# Patient Record
Sex: Male | Born: 1940 | Race: White | Hispanic: No | Marital: Married | Smoking: Former smoker
Health system: Southern US, Community
[De-identification: ages and names within clinical notes are randomized; demographics above are authoritative.]

## PROBLEM LIST (undated history)

## (undated) DIAGNOSIS — C801 Malignant (primary) neoplasm, unspecified: Secondary | ICD-10-CM

## (undated) DIAGNOSIS — R51 Headache: Secondary | ICD-10-CM

## (undated) DIAGNOSIS — J189 Pneumonia, unspecified organism: Secondary | ICD-10-CM

## (undated) DIAGNOSIS — R7303 Prediabetes: Secondary | ICD-10-CM

## (undated) DIAGNOSIS — Z87442 Personal history of urinary calculi: Secondary | ICD-10-CM

## (undated) DIAGNOSIS — I219 Acute myocardial infarction, unspecified: Secondary | ICD-10-CM

## (undated) DIAGNOSIS — G4730 Sleep apnea, unspecified: Secondary | ICD-10-CM

## (undated) DIAGNOSIS — H9190 Unspecified hearing loss, unspecified ear: Secondary | ICD-10-CM

## (undated) DIAGNOSIS — I2510 Atherosclerotic heart disease of native coronary artery without angina pectoris: Secondary | ICD-10-CM

## (undated) DIAGNOSIS — I4891 Unspecified atrial fibrillation: Secondary | ICD-10-CM

## (undated) DIAGNOSIS — E039 Hypothyroidism, unspecified: Secondary | ICD-10-CM

## (undated) DIAGNOSIS — I10 Essential (primary) hypertension: Secondary | ICD-10-CM

## (undated) DIAGNOSIS — M1990 Unspecified osteoarthritis, unspecified site: Secondary | ICD-10-CM

## (undated) DIAGNOSIS — E7800 Pure hypercholesterolemia, unspecified: Secondary | ICD-10-CM

## (undated) DIAGNOSIS — Z973 Presence of spectacles and contact lenses: Secondary | ICD-10-CM

## (undated) DIAGNOSIS — R519 Headache, unspecified: Secondary | ICD-10-CM

## (undated) HISTORY — PX: OTHER SURGICAL HISTORY: SHX169

## (undated) HISTORY — PX: CARDIAC CATHETERIZATION: SHX172

## (undated) HISTORY — PX: SKIN CANCER EXCISION: SHX779

## (undated) HISTORY — PX: CORONARY ARTERY BYPASS GRAFT: SHX141

## (undated) HISTORY — PX: SHOULDER SURGERY: SHX246

## (undated) HISTORY — PX: BACK SURGERY: SHX140

## (undated) HISTORY — PX: COLONOSCOPY: SHX174

## (undated) HISTORY — PX: MULTIPLE TOOTH EXTRACTIONS: SHX2053

---

## 2017-08-16 ENCOUNTER — Other Ambulatory Visit: Payer: Self-pay | Admitting: Orthopedic Surgery

## 2017-09-08 ENCOUNTER — Encounter (HOSPITAL_COMMUNITY)
Admission: RE | Admit: 2017-09-08 | Discharge: 2017-09-08 | Disposition: A | Discharge status: Home / Self Care | Payer: Medicare Other | Source: Ambulatory Visit | Attending: Orthopedic Surgery | Admitting: Orthopedic Surgery

## 2017-09-08 ENCOUNTER — Other Ambulatory Visit: Payer: Self-pay

## 2017-09-08 ENCOUNTER — Encounter (HOSPITAL_COMMUNITY): Payer: Self-pay

## 2017-09-08 DIAGNOSIS — E039 Hypothyroidism, unspecified: Secondary | ICD-10-CM | POA: Insufficient documentation

## 2017-09-08 DIAGNOSIS — Z79899 Other long term (current) drug therapy: Secondary | ICD-10-CM | POA: Diagnosis not present

## 2017-09-08 DIAGNOSIS — G4733 Obstructive sleep apnea (adult) (pediatric): Secondary | ICD-10-CM | POA: Insufficient documentation

## 2017-09-08 DIAGNOSIS — I4891 Unspecified atrial fibrillation: Secondary | ICD-10-CM | POA: Insufficient documentation

## 2017-09-08 DIAGNOSIS — Z7901 Long term (current) use of anticoagulants: Secondary | ICD-10-CM | POA: Diagnosis not present

## 2017-09-08 DIAGNOSIS — Z951 Presence of aortocoronary bypass graft: Secondary | ICD-10-CM | POA: Diagnosis not present

## 2017-09-08 DIAGNOSIS — Z7982 Long term (current) use of aspirin: Secondary | ICD-10-CM | POA: Insufficient documentation

## 2017-09-08 DIAGNOSIS — I10 Essential (primary) hypertension: Secondary | ICD-10-CM | POA: Diagnosis not present

## 2017-09-08 DIAGNOSIS — M75122 Complete rotator cuff tear or rupture of left shoulder, not specified as traumatic: Secondary | ICD-10-CM | POA: Diagnosis not present

## 2017-09-08 DIAGNOSIS — I2510 Atherosclerotic heart disease of native coronary artery without angina pectoris: Secondary | ICD-10-CM | POA: Insufficient documentation

## 2017-09-08 DIAGNOSIS — E119 Type 2 diabetes mellitus without complications: Secondary | ICD-10-CM | POA: Diagnosis not present

## 2017-09-08 DIAGNOSIS — Z01812 Encounter for preprocedural laboratory examination: Secondary | ICD-10-CM | POA: Insufficient documentation

## 2017-09-08 HISTORY — DX: Atherosclerotic heart disease of native coronary artery without angina pectoris: I25.10

## 2017-09-08 HISTORY — DX: Unspecified osteoarthritis, unspecified site: M19.90

## 2017-09-08 HISTORY — DX: Malignant (primary) neoplasm, unspecified: C80.1

## 2017-09-08 HISTORY — DX: Pneumonia, unspecified organism: J18.9

## 2017-09-08 HISTORY — DX: Unspecified atrial fibrillation: I48.91

## 2017-09-08 HISTORY — DX: Headache, unspecified: R51.9

## 2017-09-08 HISTORY — DX: Pure hypercholesterolemia, unspecified: E78.00

## 2017-09-08 HISTORY — DX: Essential (primary) hypertension: I10

## 2017-09-08 HISTORY — DX: Hypothyroidism, unspecified: E03.9

## 2017-09-08 HISTORY — DX: Headache: R51

## 2017-09-08 HISTORY — DX: Acute myocardial infarction, unspecified: I21.9

## 2017-09-08 HISTORY — DX: Personal history of urinary calculi: Z87.442

## 2017-09-08 HISTORY — DX: Prediabetes: R73.03

## 2017-09-08 HISTORY — DX: Unspecified hearing loss, unspecified ear: H91.90

## 2017-09-08 HISTORY — DX: Presence of spectacles and contact lenses: Z97.3

## 2017-09-08 HISTORY — DX: Sleep apnea, unspecified: G47.30

## 2017-09-08 LAB — COMPREHENSIVE METABOLIC PANEL
ALBUMIN: 4 g/dL (ref 3.5–5.0)
ALK PHOS: 68 U/L (ref 38–126)
ALT: 43 U/L (ref 0–44)
ANION GAP: 7 (ref 5–15)
AST: 24 U/L (ref 15–41)
BILIRUBIN TOTAL: 2 mg/dL — AB (ref 0.3–1.2)
BUN: 14 mg/dL (ref 8–23)
CALCIUM: 8.9 mg/dL (ref 8.9–10.3)
CO2: 31 mmol/L (ref 22–32)
CREATININE: 0.94 mg/dL (ref 0.61–1.24)
Chloride: 100 mmol/L (ref 98–111)
GFR calc non Af Amer: >60 mL/min (ref >60)
GLUCOSE: 139 mg/dL — AB (ref 70–99)
Potassium: 3.2 mmol/L — ABNORMAL LOW (ref 3.5–5.1)
SODIUM: 138 mmol/L (ref 135–145)
TOTAL PROTEIN: 6.1 g/dL — AB (ref 6.5–8.1)

## 2017-09-08 LAB — CBC WITH DIFFERENTIAL/PLATELET
ABS IMMATURE GRANULOCYTES: 0 10*3/uL (ref 0.0–0.1)
Basophils Absolute: 0 10*3/uL (ref 0.0–0.1)
Basophils Relative: 1 %
EOS PCT: 2 %
Eosinophils Absolute: 0.2 10*3/uL (ref 0.0–0.7)
HEMATOCRIT: 44 % (ref 39.0–52.0)
HEMOGLOBIN: 14.8 g/dL (ref 13.0–17.0)
Immature Granulocytes: 0 %
LYMPHS ABS: 1.5 10*3/uL (ref 0.7–4.0)
LYMPHS PCT: 19 %
MCH: 30.6 pg (ref 26.0–34.0)
MCHC: 33.6 g/dL (ref 30.0–36.0)
MCV: 91.1 fL (ref 78.0–100.0)
MONO ABS: 0.9 10*3/uL (ref 0.1–1.0)
Monocytes Relative: 12 %
NEUTROS ABS: 5.3 10*3/uL (ref 1.7–7.7)
Neutrophils Relative %: 66 %
Platelets: 226 10*3/uL (ref 150–400)
RBC: 4.83 MIL/uL (ref 4.22–5.81)
RDW: 12.5 % (ref 11.5–15.5)
WBC: 8 10*3/uL (ref 4.0–10.5)

## 2017-09-08 LAB — ABO/RH: ABO/RH(D): O POS

## 2017-09-08 LAB — URINALYSIS, ROUTINE W REFLEX MICROSCOPIC
Bilirubin Urine: NEGATIVE
Glucose, UA: NEGATIVE mg/dL
Hgb urine dipstick: NEGATIVE
KETONES UR: NEGATIVE mg/dL
LEUKOCYTES UA: NEGATIVE
NITRITE: NEGATIVE
PROTEIN: NEGATIVE mg/dL
Specific Gravity, Urine: 1.008 (ref 1.005–1.030)
pH: 6 (ref 5.0–8.0)

## 2017-09-08 LAB — SURGICAL PCR SCREEN
MRSA, PCR: NEGATIVE
Staphylococcus aureus: POSITIVE — AB

## 2017-09-08 LAB — TYPE AND SCREEN
ABO/RH(D): O POS
Antibody Screen: NEGATIVE

## 2017-09-08 NOTE — Pre-Procedure Instructions (Signed)
   Eric Kim  09/08/2017      [id number], [address] AT [facility] OF [address] [place] [address] Phone: [phone] Fax: [phone]   Your procedure is scheduled on Thursday, September 15, 2017  Report to [facility] Admitting at 10:30 A.M.  Call this number if you have problems the morning of surgery:  [phone]   Remember: Follow surgeon's instructions regarding Aspirin and Coumadin  Do not eat or drink after midnight Wednesday, September 14, 2017  Take these medicines the morning of surgery with A SIP OF WATER: amLODipine (NORVASC), isosorbide mononitrate (IMDUR) , levothyroxine (SYNTHROID), metoprolol tartrate (LOPRESSOR), atorvastatin (LIPITOR)  Stop taking vitamins, fish oil, and herbal medications. Do not take any NSAIDs ie: Ibuprofen, Advil, Naproxen (Aleve), Motrin, BC and Goody Powder; stop now.   Do not wear jewelry, make-up or nail polish.  Do not wear lotions, powders, or perfumes, or deodorant.  Do not shave 48 hours prior to surgery.  Men may shave face and neck.  Do not bring valuables to the hospital.  [facility] is not responsible for any belongings or valuables.  Contacts, dentures or bridgework may not be worn into surgery.  Leave your suitcase in the car.  After surgery it may be brought to your room.  For patients admitted to the hospital, discharge time will be determined by your treatment team. Patients discharged the day of surgery will not be allowed to drive home.  Special instructions:  Shower the night before and morning of surgery with CHG. Please read over the following fact sheets that you were given. Pain Booklet, Coughing and Deep Breathing, MRSA Information and Surgical Site Infection Prevention

## 2017-09-08 NOTE — Progress Notes (Signed)
Pt attempted to complete history. However, pt is a poor historian. Pt requested to have wife accompany him to complete pre-op assessment. Pt denies SOB and chest pain. Requested cardiac cath and clearance note from [facility], Dr. Beatrix Fetters, pt cardiologist ( per pt spouse). Requested EKG tracing from [facility] (Care Everywhere). Myriam Jacobson, Surgical Coordinator, to contact pt to clarify pre-op Coumadin instructions. Pt chart forwarded to anesthesia for review of cardiac history.

## 2017-09-09 NOTE — Progress Notes (Signed)
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 Pertinent hx includes HOH, DMII (A1c 6.6 07/2017), OSA not on CPAP, HTN, Hypothyroid, MI/CAD s/p CBAG 1991 (EF 55-60% by Echo 2019), Afib.  Pt has clearance from his Cardiologist Dr. Jeneen Rinks McGukin in care everywhere under telephone encounter 09/02/17. He states "His nuclear scan was negative for any active ischemia he can stop his anticoagulation prior to his procedure but he needs to restart this given his atrial fibrillation after the procedure when deemed okay by the surgeon. Patient will need to stop Coumadin 5 days prior to surgery and restart Coumadin after surgery when deemed okay. "  Anticipate he can proceed with surgery as planned barring acute status change.  VS: BP 127/67   Pulse 63   Temp 36.6 C   Resp 20   Ht 5\' 8"  (1.727 m)   Wt 91.7 kg   SpO2 95%   BMI 30.74 kg/m   PROVIDERS: Marjory Sneddon, MD is PCP last seen 08/01/2017  Mathis Bud, MD is Cardiologist last seen 05/04/2017  LABS: Labs reviewed: Acceptable for surgery. (all labs ordered are listed, but only abnormal results are displayed)  Labs Reviewed  SURGICAL PCR SCREEN - Abnormal; Notable for the following components:      Result Value   Staphylococcus aureus POSITIVE (*)    All other components within normal limits  COMPREHENSIVE METABOLIC PANEL - Abnormal; Notable for the following components:   Potassium 3.2 (*)    Glucose, Bld 139 (*)    Total Protein 6.1 (*)    Total Bilirubin 2.0 (*)    All other components within normal limits  URINALYSIS, ROUTINE W REFLEX MICROSCOPIC - Abnormal; Notable for the following components:   Color, Urine STRAW (*)    Bacteria, UA RARE (*)    All other components within normal limits  CBC WITH DIFFERENTIAL/PLATELET  TYPE AND SCREEN  ABO/RH     IMAGES: CHEST - 2 VIEW 04/15/17  COMPARISON:  10/08/2015  FINDINGS: Postsurgical changes are again noted. Lungs are clear bilaterally. No acute infiltrate is seen. Degenerative changes of the thoracic spine are noted.  IMPRESSION: No acute abnormality.  EKG: 04/15/2017 (outside record, copy on pt chart): Sinus rhythm, RBBB, T wave abnormality, consider lateral ischemia. No significant changes.  CV: Nuclear stress 05/11/2017 (Care everywhere): Imaging Results  Stress ejection  Ejection fraction:58 %  EDV :103 ml  ESV :43 ml  Stroke volume :60 ml LV size:Enlarged LV Scintigraphic Findings Both stress and rest images demonstrate normal uptake in all walls with the exception of the apex which appears C/W apical thinning. The calculated EF is 59%. There is no evidence of convincing inducible ischemia on this study.  Echo 04/15/2017 (Care everywhere): Conclusions Summary Mild concentric left ventricular hypertrophy Normal left ventricular size and systolic function with no appreciable segmental abnormality. Ejection fraction is visually estimated at 55-60% Trace aortic regurgitation.  Past Medical History:  Diagnosis Date  . A-fib ([place])   . Arthritis   . Cancer ([place])    skin  . Coronary artery disease   . Headache   . History of kidney stones   . HOH (  hard of hearing)   . Hypercholesterolemia   . Hypertension   . Hypothyroidism   . Myocardial infarction ([place])   . Pneumonia   . Pre-diabetes   . Sleep apnea    Does not wear CPAP  . Wears contact lenses    left eye only    Past Surgical History:  Procedure Laterality Date  . BACK SURGERY    . CARDIAC CATHETERIZATION    . COLONOSCOPY    . CORONARY ARTERY BYPASS GRAFT     1991  . coronary stents    . MULTIPLE TOOTH EXTRACTIONS    . SHOULDER SURGERY    . SKIN CANCER  EXCISION      MEDICATIONS: . amLODipine (NORVASC) 10 MG tablet  . aspirin EC 81 MG tablet  . atorvastatin (LIPITOR) 40 MG tablet  . benzonatate (TESSALON) 200 MG capsule  . hydrochlorothiazide (HYDRODIURIL) 25 MG tablet  . isosorbide mononitrate (IMDUR) 60 MG 24 hr tablet  . levothyroxine (SYNTHROID, LEVOTHROID) 150 MCG tablet  . metoprolol tartrate (LOPRESSOR) 25 MG tablet  . triamcinolone cream (KENALOG) 0.1 %  . warfarin (COUMADIN) 5 MG tablet   No current facility-administered medications for this encounter.     Wynonia Musty [facility] Short Stay Center/Anesthesiology Phone [phone] 09/09/2017 12:43 PM

## 2017-09-15 ENCOUNTER — Encounter (HOSPITAL_COMMUNITY): Payer: Self-pay | Admitting: *Deleted

## 2017-09-15 ENCOUNTER — Encounter (HOSPITAL_COMMUNITY)
Admission: RE | Disposition: A | Discharge status: Home / Self Care | Payer: Self-pay | Source: Ambulatory Visit | Attending: Orthopedic Surgery

## 2017-09-15 ENCOUNTER — Other Ambulatory Visit: Payer: Self-pay

## 2017-09-15 ENCOUNTER — Inpatient Hospital Stay (HOSPITAL_COMMUNITY): Payer: No Typology Code available for payment source

## 2017-09-15 ENCOUNTER — Inpatient Hospital Stay (HOSPITAL_COMMUNITY)
Admission: RE | Admit: 2017-09-15 | Discharge: 2017-09-16 | DRG: 483 | Disposition: A | Discharge status: Home / Self Care | Payer: No Typology Code available for payment source | Source: Ambulatory Visit | Attending: Orthopedic Surgery | Admitting: Orthopedic Surgery

## 2017-09-15 ENCOUNTER — Inpatient Hospital Stay (HOSPITAL_COMMUNITY): Payer: No Typology Code available for payment source | Admitting: Physician Assistant

## 2017-09-15 DIAGNOSIS — I2510 Atherosclerotic heart disease of native coronary artery without angina pectoris: Secondary | ICD-10-CM | POA: Diagnosis present

## 2017-09-15 DIAGNOSIS — M75122 Complete rotator cuff tear or rupture of left shoulder, not specified as traumatic: Principal | ICD-10-CM | POA: Diagnosis present

## 2017-09-15 DIAGNOSIS — Z955 Presence of coronary angioplasty implant and graft: Secondary | ICD-10-CM

## 2017-09-15 DIAGNOSIS — Z87891 Personal history of nicotine dependence: Secondary | ICD-10-CM | POA: Diagnosis not present

## 2017-09-15 DIAGNOSIS — Z951 Presence of aortocoronary bypass graft: Secondary | ICD-10-CM

## 2017-09-15 DIAGNOSIS — G4730 Sleep apnea, unspecified: Secondary | ICD-10-CM | POA: Diagnosis present

## 2017-09-15 DIAGNOSIS — M25712 Osteophyte, left shoulder: Secondary | ICD-10-CM | POA: Diagnosis present

## 2017-09-15 DIAGNOSIS — I10 Essential (primary) hypertension: Secondary | ICD-10-CM | POA: Diagnosis present

## 2017-09-15 DIAGNOSIS — Z96612 Presence of left artificial shoulder joint: Secondary | ICD-10-CM

## 2017-09-15 DIAGNOSIS — I252 Old myocardial infarction: Secondary | ICD-10-CM | POA: Diagnosis not present

## 2017-09-15 HISTORY — PX: REVERSE SHOULDER ARTHROPLASTY: SHX5054

## 2017-09-15 LAB — GLUCOSE, CAPILLARY: Glucose-Capillary: 113 mg/dL — ABNORMAL HIGH (ref 70–99)

## 2017-09-15 LAB — PROTIME-INR
INR: 1.09
PROTHROMBIN TIME: 14 s (ref 11.4–15.2)

## 2017-09-15 LAB — APTT: APTT: 28 s (ref 24–36)

## 2017-09-15 SURGERY — ARTHROPLASTY, SHOULDER, TOTAL, REVERSE
Anesthesia: General | Laterality: Left

## 2017-09-15 MED ORDER — PHENYLEPHRINE 40 MCG/ML (10ML) SYRINGE FOR IV PUSH (FOR BLOOD PRESSURE SUPPORT): PREFILLED_SYRINGE | INTRAVENOUS | Status: DC | PRN

## 2017-09-15 MED ORDER — WARFARIN SODIUM 5 MG PO TABS
5.0000 mg | ORAL_TABLET | Freq: Every evening | ORAL | Status: DC
  Administered 2017-09-15: 5 mg via ORAL
  Filled 2017-09-15: qty 1

## 2017-09-15 MED ORDER — ACETAMINOPHEN 500 MG PO TABS
1000.0000 mg | ORAL_TABLET | Freq: Four times a day (QID) | ORAL | Status: DC
  Administered 2017-09-15 – 2017-09-16 (×3): 1000 mg via ORAL
  Filled 2017-09-15 (×3): qty 2

## 2017-09-15 MED ORDER — CEFAZOLIN SODIUM-DEXTROSE 2-4 GM/100ML-% IV SOLN
INTRAVENOUS | Status: AC
  Filled 2017-09-15: qty 100

## 2017-09-15 MED ORDER — DEXAMETHASONE SODIUM PHOSPHATE 10 MG/ML IJ SOLN
INTRAMUSCULAR | Status: AC
  Filled 2017-09-15: qty 1

## 2017-09-15 MED ORDER — CEFAZOLIN SODIUM-DEXTROSE 2-4 GM/100ML-% IV SOLN
2.0000 g | INTRAVENOUS | Status: AC
  Administered 2017-09-15: 2 g via INTRAVENOUS

## 2017-09-15 MED ORDER — AMLODIPINE BESYLATE 10 MG PO TABS: 10.0000 mg | ORAL_TABLET | Freq: Every day | ORAL | Status: DC

## 2017-09-15 MED ORDER — PROPOFOL 10 MG/ML IV BOLUS
INTRAVENOUS | Status: AC
  Filled 2017-09-15: qty 20

## 2017-09-15 MED ORDER — FENTANYL CITRATE (PF) 100 MCG/2ML IJ SOLN
INTRAMUSCULAR | Status: AC
  Administered 2017-09-15: 100 ug via INTRAVENOUS
  Filled 2017-09-15: qty 2

## 2017-09-15 MED ORDER — LACTATED RINGERS IV SOLN
INTRAVENOUS | Status: DC
  Administered 2017-09-15 (×2): via INTRAVENOUS

## 2017-09-15 MED ORDER — METOCLOPRAMIDE HCL 5 MG PO TABS: 5.0000 mg | ORAL_TABLET | Freq: Three times a day (TID) | ORAL | Status: DC | PRN

## 2017-09-15 MED ORDER — MENTHOL 3 MG MT LOZG: 1.0000 | LOZENGE | OROMUCOSAL | Status: DC | PRN

## 2017-09-15 MED ORDER — DIPHENHYDRAMINE HCL 12.5 MG/5ML PO ELIX: 12.5000 mg | ORAL_SOLUTION | ORAL | Status: DC | PRN

## 2017-09-15 MED ORDER — SODIUM CHLORIDE 0.9 % IV SOLN
INTRAVENOUS | Status: DC
  Administered 2017-09-15: 16:00:00 via INTRAVENOUS

## 2017-09-15 MED ORDER — METOCLOPRAMIDE HCL 5 MG/ML IJ SOLN: 5.0000 mg | Freq: Three times a day (TID) | INTRAMUSCULAR | Status: DC | PRN

## 2017-09-15 MED ORDER — ONDANSETRON HCL 4 MG/2ML IJ SOLN
INTRAMUSCULAR | Status: AC
  Filled 2017-09-15: qty 2

## 2017-09-15 MED ORDER — POLYETHYLENE GLYCOL 3350 17 G PO PACK: 17.0000 g | PACK | Freq: Every day | ORAL | Status: DC | PRN

## 2017-09-15 MED ORDER — OXYCODONE HCL 5 MG PO TABS: 10.0000 mg | ORAL_TABLET | ORAL | Status: DC | PRN

## 2017-09-15 MED ORDER — BISACODYL 5 MG PO TBEC: 5.0000 mg | DELAYED_RELEASE_TABLET | Freq: Every day | ORAL | Status: DC | PRN

## 2017-09-15 MED ORDER — SUGAMMADEX SODIUM 200 MG/2ML IV SOLN
INTRAVENOUS | Status: DC | PRN
  Administered 2017-09-15: 200 mg via INTRAVENOUS

## 2017-09-15 MED ORDER — ONDANSETRON HCL 4 MG PO TABS: 4.0000 mg | ORAL_TABLET | Freq: Four times a day (QID) | ORAL | Status: DC | PRN

## 2017-09-15 MED ORDER — LEVOTHYROXINE SODIUM 75 MCG PO TABS
150.0000 ug | ORAL_TABLET | Freq: Every day | ORAL | Status: DC
  Administered 2017-09-16: 150 ug via ORAL
  Filled 2017-09-15: qty 2

## 2017-09-15 MED ORDER — ONDANSETRON HCL 4 MG/2ML IJ SOLN: 4.0000 mg | Freq: Four times a day (QID) | INTRAMUSCULAR | Status: DC | PRN

## 2017-09-15 MED ORDER — ZOLPIDEM TARTRATE 5 MG PO TABS: 5.0000 mg | ORAL_TABLET | Freq: Every evening | ORAL | Status: DC | PRN

## 2017-09-15 MED ORDER — POVIDONE-IODINE 7.5 % EX SOLN
Freq: Once | CUTANEOUS | Status: AC
  Administered 2017-09-15: 11:00:00 via TOPICAL

## 2017-09-15 MED ORDER — FENTANYL CITRATE (PF) 250 MCG/5ML IJ SOLN
INTRAMUSCULAR | Status: AC
  Filled 2017-09-15: qty 5

## 2017-09-15 MED ORDER — WARFARIN - PHYSICIAN DOSING INPATIENT: Freq: Every day | Status: DC

## 2017-09-15 MED ORDER — ASPIRIN EC 81 MG PO TBEC
81.0000 mg | DELAYED_RELEASE_TABLET | Freq: Two times a day (BID) | ORAL | Status: DC
  Administered 2017-09-15 – 2017-09-16 (×2): 81 mg via ORAL
  Filled 2017-09-15 (×2): qty 1

## 2017-09-15 MED ORDER — PHENOL 1.4 % MT LIQD: 1.0000 | OROMUCOSAL | Status: DC | PRN

## 2017-09-15 MED ORDER — HYDROCHLOROTHIAZIDE 25 MG PO TABS
25.0000 mg | ORAL_TABLET | Freq: Every day | ORAL | Status: DC
  Administered 2017-09-15: 25 mg via ORAL
  Filled 2017-09-15: qty 1

## 2017-09-15 MED ORDER — METHOCARBAMOL 1000 MG/10ML IJ SOLN
500.0000 mg | Freq: Four times a day (QID) | INTRAVENOUS | Status: DC | PRN
  Filled 2017-09-15: qty 5

## 2017-09-15 MED ORDER — ONDANSETRON HCL 4 MG/2ML IJ SOLN
INTRAMUSCULAR | Status: DC | PRN
  Administered 2017-09-15: 4 mg via INTRAVENOUS

## 2017-09-15 MED ORDER — ATORVASTATIN CALCIUM 40 MG PO TABS
40.0000 mg | ORAL_TABLET | Freq: Every day | ORAL | Status: DC
  Administered 2017-09-15: 40 mg via ORAL
  Filled 2017-09-15: qty 1

## 2017-09-15 MED ORDER — MIDAZOLAM HCL 2 MG/2ML IJ SOLN
1.0000 mg | Freq: Once | INTRAMUSCULAR | Status: AC
  Administered 2017-09-15: 1 mg via INTRAVENOUS

## 2017-09-15 MED ORDER — ROCURONIUM BROMIDE 50 MG/5ML IV SOSY
PREFILLED_SYRINGE | INTRAVENOUS | Status: AC
  Filled 2017-09-15: qty 5

## 2017-09-15 MED ORDER — CEFAZOLIN SODIUM-DEXTROSE 2-4 GM/100ML-% IV SOLN
2.0000 g | Freq: Four times a day (QID) | INTRAVENOUS | Status: AC
  Administered 2017-09-15 – 2017-09-16 (×3): 2 g via INTRAVENOUS
  Filled 2017-09-15 (×3): qty 100

## 2017-09-15 MED ORDER — BUPIVACAINE HCL (PF) 0.5 % IJ SOLN
INTRAMUSCULAR | Status: DC | PRN
  Administered 2017-09-15: 12 mL via PERINEURAL

## 2017-09-15 MED ORDER — GLYCOPYRROLATE PF 0.2 MG/ML IJ SOSY
PREFILLED_SYRINGE | INTRAMUSCULAR | Status: AC
  Filled 2017-09-15: qty 1

## 2017-09-15 MED ORDER — METOPROLOL TARTRATE 25 MG PO TABS
25.0000 mg | ORAL_TABLET | Freq: Two times a day (BID) | ORAL | Status: DC
  Filled 2017-09-15: qty 1

## 2017-09-15 MED ORDER — FLEET ENEMA 7-19 GM/118ML RE ENEM: 1.0000 | ENEMA | Freq: Once | RECTAL | Status: DC | PRN

## 2017-09-15 MED ORDER — BUPIVACAINE LIPOSOME 1.3 % IJ SUSP
INTRAMUSCULAR | Status: DC | PRN
  Administered 2017-09-15: 10 mL via PERINEURAL

## 2017-09-15 MED ORDER — ALUM & MAG HYDROXIDE-SIMETH 200-200-20 MG/5ML PO SUSP: 30.0000 mL | ORAL | Status: DC | PRN

## 2017-09-15 MED ORDER — SUGAMMADEX SODIUM 500 MG/5ML IV SOLN
INTRAVENOUS | Status: AC
  Filled 2017-09-15: qty 5

## 2017-09-15 MED ORDER — ASPIRIN EC 81 MG PO TBEC: 81.0000 mg | DELAYED_RELEASE_TABLET | Freq: Every day | ORAL | Status: DC

## 2017-09-15 MED ORDER — METHOCARBAMOL 500 MG PO TABS: 500.0000 mg | ORAL_TABLET | Freq: Four times a day (QID) | ORAL | Status: DC | PRN

## 2017-09-15 MED ORDER — DEXAMETHASONE SODIUM PHOSPHATE 10 MG/ML IJ SOLN
INTRAMUSCULAR | Status: DC | PRN
  Administered 2017-09-15: 10 mg via INTRAVENOUS

## 2017-09-15 MED ORDER — DOCUSATE SODIUM 100 MG PO CAPS
100.0000 mg | ORAL_CAPSULE | Freq: Two times a day (BID) | ORAL | Status: DC
  Administered 2017-09-15: 100 mg via ORAL
  Filled 2017-09-15: qty 1

## 2017-09-15 MED ORDER — FENTANYL CITRATE (PF) 250 MCG/5ML IJ SOLN
INTRAMUSCULAR | Status: DC | PRN
  Administered 2017-09-15: 100 ug via INTRAVENOUS

## 2017-09-15 MED ORDER — ISOSORBIDE MONONITRATE ER 60 MG PO TB24: 60.0000 mg | ORAL_TABLET | Freq: Every day | ORAL | Status: DC

## 2017-09-15 MED ORDER — OXYCODONE HCL 5 MG PO TABS: 5.0000 mg | ORAL_TABLET | ORAL | Status: DC | PRN

## 2017-09-15 MED ORDER — SODIUM CHLORIDE 0.9 % IV SOLN
INTRAVENOUS | Status: DC | PRN
  Administered 2017-09-15: 25 ug/min via INTRAVENOUS

## 2017-09-15 MED ORDER — BENZONATATE 100 MG PO CAPS: 200.0000 mg | ORAL_CAPSULE | Freq: Three times a day (TID) | ORAL | Status: DC | PRN

## 2017-09-15 MED ORDER — BUPIVACAINE-EPINEPHRINE (PF) 0.5% -1:200000 IJ SOLN: INTRAMUSCULAR | Status: DC | PRN

## 2017-09-15 MED ORDER — MIDAZOLAM HCL 2 MG/2ML IJ SOLN
INTRAMUSCULAR | Status: AC
  Administered 2017-09-15: 1 mg via INTRAVENOUS
  Filled 2017-09-15: qty 2

## 2017-09-15 MED ORDER — LIDOCAINE 2% (20 MG/ML) 5 ML SYRINGE
INTRAMUSCULAR | Status: DC | PRN
  Administered 2017-09-15: 100 mg via INTRAVENOUS

## 2017-09-15 MED ORDER — HYDROMORPHONE HCL 1 MG/ML IJ SOLN: 0.5000 mg | INTRAMUSCULAR | Status: DC | PRN

## 2017-09-15 MED ORDER — PROPOFOL 10 MG/ML IV BOLUS
INTRAVENOUS | Status: DC | PRN
  Administered 2017-09-15: 140 mg via INTRAVENOUS

## 2017-09-15 MED ORDER — ACETAMINOPHEN 325 MG PO TABS: 325.0000 mg | ORAL_TABLET | Freq: Four times a day (QID) | ORAL | Status: DC | PRN

## 2017-09-15 MED ORDER — FENTANYL CITRATE (PF) 100 MCG/2ML IJ SOLN
100.0000 ug | Freq: Once | INTRAMUSCULAR | Status: AC
  Administered 2017-09-15: 100 ug via INTRAVENOUS

## 2017-09-15 MED ORDER — ROCURONIUM BROMIDE 10 MG/ML (PF) SYRINGE
PREFILLED_SYRINGE | INTRAVENOUS | Status: DC | PRN
  Administered 2017-09-15: 50 mg via INTRAVENOUS

## 2017-09-15 MED ORDER — SODIUM CHLORIDE 0.9 % IR SOLN
Status: DC | PRN
  Administered 2017-09-15 (×2): 1000 mL

## 2017-09-15 SURGICAL SUPPLY — 79 items
BASEPLATE P2 COATD GLND 6.5X30 (Shoulder) ×1 IMPLANT
BIT DRILL 2.5 DIA 127 CALI (BIT) ×2 IMPLANT
BIT DRILL 4 DIA CALIBRATED (BIT) ×2 IMPLANT
BIT DRILL 5/64X5 DISP (BIT) IMPLANT
BLADE SAW SAG 73X25 THK (BLADE) ×1
BLADE SAW SGTL 73X25 THK (BLADE) ×1 IMPLANT
BLADE SURG 15 STRL LF DISP TIS (BLADE) IMPLANT
BLADE SURG 15 STRL SS (BLADE)
BOWL SMART MIX CTS (DISPOSABLE) IMPLANT
CHLORAPREP W/TINT 26ML (MISCELLANEOUS) ×2 IMPLANT
CLSR STERI-STRIP ANTIMIC 1/2X4 (GAUZE/BANDAGES/DRESSINGS) ×2 IMPLANT
COVER MAYO STAND STRL (DRAPES) IMPLANT
COVER SURGICAL LIGHT HANDLE (MISCELLANEOUS) ×2 IMPLANT
DRAPE INCISE IOBAN 66X45 STRL (DRAPES) ×2 IMPLANT
DRAPE ORTHO SPLIT 77X108 STRL (DRAPES) ×2
DRAPE SURG ORHT 6 SPLT 77X108 (DRAPES) ×2 IMPLANT
DRSG AQUACEL AG ADV 3.5X 6 (GAUZE/BANDAGES/DRESSINGS) ×2 IMPLANT
DRSG AQUACEL AG ADV 3.5X10 (GAUZE/BANDAGES/DRESSINGS) ×2 IMPLANT
ELECT BLADE 4.0 EZ CLEAN MEGAD (MISCELLANEOUS) ×2
ELECT REM PT RETURN 9FT ADLT (ELECTROSURGICAL) ×2
ELECTRODE BLDE 4.0 EZ CLN MEGD (MISCELLANEOUS) ×1 IMPLANT
ELECTRODE REM PT RTRN 9FT ADLT (ELECTROSURGICAL) ×1 IMPLANT
EVACUATOR 1/8 PVC DRAIN (DRAIN) IMPLANT
GLOVE BIO SURGEON STRL SZ7 (GLOVE) ×2 IMPLANT
GLOVE BIO SURGEON STRL SZ7.5 (GLOVE) ×2 IMPLANT
GLOVE BIOGEL PI IND STRL 7.0 (GLOVE) ×1 IMPLANT
GLOVE BIOGEL PI IND STRL 8 (GLOVE) ×1 IMPLANT
GLOVE BIOGEL PI INDICATOR 7.0 (GLOVE) ×1
GLOVE BIOGEL PI INDICATOR 8 (GLOVE) ×1
GOWN STRL REUS W/ TWL LRG LVL3 (GOWN DISPOSABLE) ×3 IMPLANT
GOWN STRL REUS W/ TWL XL LVL3 (GOWN DISPOSABLE) ×1 IMPLANT
GOWN STRL REUS W/TWL LRG LVL3 (GOWN DISPOSABLE) ×3
GOWN STRL REUS W/TWL XL LVL3 (GOWN DISPOSABLE) ×1
HANDPIECE INTERPULSE COAX TIP (DISPOSABLE) ×1
HOOD PEEL AWAY FLYTE STAYCOOL (MISCELLANEOUS) ×4 IMPLANT
INSERT EPOLY STND HUMERUS 32MM (Shoulder) ×2 IMPLANT
INSERT EPOLYSTD HUMERUS 32MM (Shoulder) ×1 IMPLANT
KIT BASIN OR (CUSTOM PROCEDURE TRAY) ×2 IMPLANT
KIT TURNOVER KIT B (KITS) ×2 IMPLANT
MANIFOLD NEPTUNE II (INSTRUMENTS) ×2 IMPLANT
NEEDLE MAYO TROCAR (NEEDLE) IMPLANT
NS IRRIG 1000ML POUR BTL (IV SOLUTION) ×2 IMPLANT
P2 COATDE GLNOID BSEPLT 6.5X30 (Shoulder) ×2 IMPLANT
PACK SHOULDER (CUSTOM PROCEDURE TRAY) ×2 IMPLANT
PAD ARMBOARD 7.5X6 YLW CONV (MISCELLANEOUS) ×4 IMPLANT
RESTRAINT HEAD UNIVERSAL NS (MISCELLANEOUS) ×2 IMPLANT
RETRIEVER SUT HEWSON (MISCELLANEOUS) IMPLANT
SCREW BONE LOCKING RSP 5.0X30 (Screw) ×2 IMPLANT
SCREW BONE LOCKING RSP 5.0X34 (Screw) ×2 IMPLANT
SCREW BONE RSP LOCK 5X18 (Screw) ×1 IMPLANT
SCREW BONE RSP LOCK 5X26 (Screw) ×1 IMPLANT
SCREW BONE RSP LOCK 5X30 (Screw) ×1 IMPLANT
SCREW BONE RSP LOCK 5X34 (Screw) ×1 IMPLANT
SCREW BONE RSP LOCKING 18MM LG (Screw) ×2 IMPLANT
SCREW BONE RSP LOCKING 5.0X26 (Screw) ×2 IMPLANT
SCREW RETAIN W/HEAD 32MM (Shoulder) ×2 IMPLANT
SET HNDPC FAN SPRY TIP SCT (DISPOSABLE) ×1 IMPLANT
SLING ARM FOAM STRAP LRG (SOFTGOODS) ×2 IMPLANT
SLING ARM FOAM STRAP MED (SOFTGOODS) IMPLANT
SLING ULTRA II LARGE (SOFTGOODS) ×2 IMPLANT
SPONGE LAP 18X18 X RAY DECT (DISPOSABLE) IMPLANT
SPONGE LAP 4X18 RFD (DISPOSABLE) IMPLANT
STEM REV PRIMARY 6X108 (Shoulder) ×2 IMPLANT
STRIP CLOSURE SKIN 1/2X4 (GAUZE/BANDAGES/DRESSINGS) ×2 IMPLANT
SUCTION FRAZIER HANDLE 10FR (MISCELLANEOUS) ×1
SUCTION TUBE FRAZIER 10FR DISP (MISCELLANEOUS) ×1 IMPLANT
SUPPORT WRAP ARM LG (MISCELLANEOUS) ×2 IMPLANT
SUT ETHIBOND NAB CT1 #1 30IN (SUTURE) ×2 IMPLANT
SUT FIBERWIRE #2 38 T-5 BLUE (SUTURE)
SUT MNCRL AB 4-0 PS2 18 (SUTURE) ×2 IMPLANT
SUT SILK 2 0 TIES 17X18 (SUTURE)
SUT SILK 2-0 18XBRD TIE BLK (SUTURE) IMPLANT
SUT VIC AB 2-0 CT1 27 (SUTURE) ×1
SUT VIC AB 2-0 CT1 TAPERPNT 27 (SUTURE) ×1 IMPLANT
SUTURE FIBERWR #2 38 T-5 BLUE (SUTURE) IMPLANT
SYR 30ML LL (SYRINGE) IMPLANT
TOWEL OR 17X26 10 PK STRL BLUE (TOWEL DISPOSABLE) ×2 IMPLANT
WATER STERILE IRR 1000ML POUR (IV SOLUTION) ×2 IMPLANT
YANKAUER SUCT BULB TIP NO VENT (SUCTIONS) IMPLANT

## 2017-09-15 NOTE — Anesthesia Preprocedure Evaluation (Signed)
Anesthesia Evaluation  Patient identified by MRN, date of birth, ID band Patient awake    Reviewed: Allergy & Precautions, NPO status , Patient's Chart, lab work & pertinent test results, reviewed documented beta blocker date and time   History of Anesthesia Complications Negative for: history of anesthetic complications  Airway Mallampati: II  TM Distance: >3 FB Neck ROM: Full    Dental no notable dental hx. (+) Dental Advisory Given   Pulmonary sleep apnea and Continuous Positive Airway Pressure Ventilation , former smoker,    Pulmonary exam normal        Cardiovascular hypertension, Pt. on home beta blockers and Pt. on medications + CAD, + Past MI, + Cardiac Stents and + CABG  Normal cardiovascular exam  Nuclear stress 05/11/2017 (Care everywhere): Imaging Results  Stress ejection  Ejection fraction:58 %  EDV :103 ml  ESV :43 ml  Stroke volume :60 ml LV size:Enlarged LV Scintigraphic Findings Both stress and rest images demonstrate normal uptake in all walls with the exception of the apex which appears C/W apical thinning. The calculated EF is 59%. There is no evidence of convincing inducible ischemia on this study.  Echo 04/15/2017 (Care everywhere): Conclusions Summary Mild concentric left ventricular hypertrophy Normal left ventricular size and systolic function with no appreciable segmental abnormality. Ejection fraction is visually estimated at 55-60% Trace aortic regurgitation.   Neuro/Psych negative neurological ROS  negative psych ROS   GI/Hepatic negative GI ROS, Neg liver ROS,   Endo/Other  negative endocrine ROSHypothyroidism   Renal/GU negative Renal ROS  negative genitourinary   Musculoskeletal negative musculoskeletal ROS (+)   Abdominal   Peds negative pediatric ROS (+)  Hematology negative hematology ROS (+)   Anesthesia Other Findings   Reproductive/Obstetrics negative  OB ROS                             Anesthesia Physical Anesthesia Plan  ASA: III  Anesthesia Plan: General   Post-op Pain Management:    Induction: Intravenous  PONV Risk Score and Plan: 2 and Ondansetron and Dexamethasone  Airway Management Planned: Oral ETT  Additional Equipment:   Intra-op Plan:   Post-operative Plan: Extubation in OR  Informed Consent: I have reviewed the patients History and Physical, chart, labs and discussed the procedure including the risks, benefits and alternatives for the proposed anesthesia with the patient or authorized representative who has indicated his/her understanding and acceptance.   Dental advisory given  Plan Discussed with: CRNA and Anesthesiologist  Anesthesia Plan Comments:         Anesthesia Quick Evaluation

## 2017-09-15 NOTE — Op Note (Signed)
Procedure(s): LEFT REVERSE SHOULDER ARTHROPLASTY Procedure Note  AVYAY COGER male 77 y.o. 09/15/2017  Procedure(s) and Anesthesia Type:    * LEFT REVERSE SHOULDER ARTHROPLASTY - Choice   Indications:  77 y.o. male  With irrepairable rotator cuff tear. Pain and dysfunction interfered with quality of life and nonoperative treatment with activity modification, NSAIDS and injections failed.     Surgeon: Isabella Stalling   Assistants: Jeanmarie Hubert PA-C [facility] was present and scrubbed throughout the procedure and was essential in positioning, retraction, exposure, and closure)  Anesthesia: General endotracheal anesthesia with preoperative interscalene block given by the attending anesthesiologist   Procedure Detail  LEFT REVERSE SHOULDER ARTHROPLASTY   Estimated Blood Loss:  200 mL         Drains: none  Blood Given: none          Specimens: none        Complications:  * No complications entered in OR log *         Disposition: PACU - hemodynamically stable.         Condition: stable      OPERATIVE FINDINGS:  A DJO Altivate pressfit reverse total shoulder arthroplasty was placed with a  size 6 stem, a 32 glenosphere, and a standard poly insert. The base plate  fixation was excellent.  PROCEDURE: The patient was identified in the preoperative holding area  where I personally marked the operative site after verifying site, side,  and procedure with the patient. An interscalene block given by  the attending anesthesiologist in the holding area and the patient was taken back to the operating room where all extremities were  carefully padded in position after general anesthesia was induced. She  was placed in a beach-chair position and the operative upper extremity was  prepped and draped in a standard sterile fashion. An approximately 10-  cm incision was made from the tip of the coracoid process to the center  point of the humerus at the level of the axilla.  Dissection was carried  down through subcutaneous tissues to the level of the cephalic vein  which was taken laterally with the deltoid. The pectoralis major was  retracted medially. The subdeltoid space was developed and the lateral  edge of the conjoined tendon was identified. The undersurface of  conjoined tendon was palpated and the musculocutaneous nerve was not in  the field. Retractor was placed underneath the conjoined and second  retractor was placed lateral into the deltoid. The circumflex humeral  artery and vessels were identified and clamped and coagulated. The  biceps tendon was absent.  The subscapularis was taken down as a peel.  The  joint was then gently externally rotated while the capsule was released  from the humeral neck around to just beyond the 6 o'clock position. At  this point, the joint was dislocated and the humeral head was presented  into the wound. The excessive osteophyte formation was removed with a  large rongeur.  The cutting guide was used to make the appropriate  head cut and the head was saved for potentially bone grafting.  The glenoid was exposed with the arm in an  abducted extended position. The anterior and posterior labrum were  completely excised and the capsule was released circumferentially to  allow for exposure of the glenoid for preparation. The 2.5 mm drill was  placed using the guide in 5-10 inferior angulation and the tap was then advanced in the same hole. Small and large reamers were  then used. The tap was then removed and the Metaglene was then screwed in with excellent purchase.  The peripheral guide was then used to drilled measured and filled peripheral locking screws. The size  32 standard  glenosphere was then impacted on the [facility] taper and the central screw was placed. The humerus was then again exposed and the diaphyseal reamers were used followed by the metaphyseal reamers. The final broach was left in place in the proximal trial  was placed. The joint was reduced and with this implant it was felt that soft tissue tensioning was appropriate with excellent stability and excellent range of motion. Therefore, final humeral stem was placed press fit with bone grafting.  And then the trial polyethylene inserts were tested again and the above implant was felt to be the most appropriate for final insertion. The joint was reduced taken through full range of motion and felt to be stable. Soft tissue tension was appropriate.  The joint was then copiously irrigated with pulse  lavage and the wound was then closed. The subscapularis was repaired up front with a single fiberwire to bring some tissue in front of the joint.  Skin was closed with 2-0 Vicryl in a deep dermal layer and 4-0  Monocryl for skin closure. Steri-Strips were applied. Sterile  dressings were then applied as well as a sling. The patient was allowed  to awaken from general anesthesia, transferred to stretcher, and taken  to recovery room in stable condition.   POSTOPERATIVE PLAN: The patient will be kept in the hospital postoperatively  for pain control and therapy.

## 2017-09-15 NOTE — Anesthesia Procedure Notes (Signed)
Anesthesia Regional Block: Interscalene brachial plexus block   Pre-Anesthetic Checklist: ,, timeout performed, Correct Patient, Correct Site, Correct Laterality, Correct Procedure, Correct Position, site marked, Risks and benefits discussed,  Surgical consent,  Pre-op evaluation,  At surgeon's request and post-op pain management  Laterality: Left  Prep: chloraprep       Needles:  Injection technique: Single-shot  Needle Type: Echogenic Stimulator Needle     Needle Length: 5cm  Needle Gauge: 22     Additional Needles:   Narrative:  Start time: 09/15/2017 11:36 AM End time: 09/15/2017 11:46 AM Injection made incrementally with aspirations every 5 mL.  Performed by: Personally  Anesthesiologist: Duane Boston, MD  Additional Notes: Functioning IV was confirmed and monitors applied.  A 12mm 22ga echogenic arrow stimulator was used. Sterile prep and drape,hand hygiene and sterile gloves were used.Ultrasound guidance: relevant anatomy identified, needle position confirmed, local anesthetic spread visualized around nerve(s)., vascular puncture avoided.  Image printed for medical record.  Negative aspiration and negative test dose prior to incremental administration of local anesthetic. The patient tolerated the procedure well.

## 2017-09-15 NOTE — Anesthesia Postprocedure Evaluation (Signed)
Anesthesia Post Note  Patient: Eric Kim  Procedure(s) Performed: LEFT REVERSE SHOULDER ARTHROPLASTY (Left )     Patient location during evaluation: PACU Anesthesia Type: General Level of consciousness: sedated Pain management: pain level controlled Vital Signs Assessment: post-procedure vital signs reviewed and stable Respiratory status: spontaneous breathing and respiratory function stable Cardiovascular status: stable Postop Assessment: no apparent nausea or vomiting Anesthetic complications: no    Last Vitals:  Vitals:   09/15/17 1506 09/15/17 1519  BP:  127/71  Pulse: (!) 56 60  Resp: 12 (!) 6  Temp:  36.6 C  SpO2: 94% 91%    Last Pain:  Vitals:   09/15/17 1519  TempSrc: Oral  PainSc:                  Deniece Rankin DANIEL

## 2017-09-15 NOTE — H&P (Signed)
Eric Kim is an 77 y.o. male.   Chief Complaint: L shoulder pain and dsyfunction  HPI: s/p injury at work with left shoulder rotator cuff tear status post repair with failure of repair and chronic pain and dysfunction.  Indicated for reverse total shoulder arthroplasty to decrease pain and improve function.  Past Medical History:  Diagnosis Date  . A-fib ([place])   . Arthritis   . Cancer ([place])    skin  . Coronary artery disease   . Headache   . History of kidney stones   . HOH (hard of hearing)   . Hypercholesterolemia   . Hypertension   . Hypothyroidism   . Myocardial infarction ([place])   . Pneumonia   . Pre-diabetes   . Sleep apnea    Does not wear CPAP  . Wears contact lenses    left eye only    Past Surgical History:  Procedure Laterality Date  . BACK SURGERY    . CARDIAC CATHETERIZATION    . COLONOSCOPY    . CORONARY ARTERY BYPASS GRAFT     1991  . coronary stents    . MULTIPLE TOOTH EXTRACTIONS    . SHOULDER SURGERY    . SKIN CANCER EXCISION      Family History  Problem Relation Age of Onset  . Cirrhosis Mother   . Diabetes Father    Social History:  reports that he has quit smoking. His smoking use included cigars. He has quit using smokeless tobacco.  His smokeless tobacco use included chew. He reports that he does not drink alcohol or use drugs.  Allergies: No Active Allergies  Medications Prior to Admission  Medication Sig Dispense Refill  . amLODipine (NORVASC) 10 MG tablet Take 10 mg by mouth daily.    Marland Kitchen aspirin EC 81 MG tablet Take 81 mg by mouth daily.    Marland Kitchen atorvastatin (LIPITOR) 40 MG tablet Take 40 mg by mouth daily.    . benzonatate (TESSALON) 200 MG capsule Take 200 mg by mouth 3 (three) times daily as needed for cough.    . hydrochlorothiazide (HYDRODIURIL) 25 MG tablet Take 25 mg by mouth daily.    . isosorbide mononitrate (IMDUR) 60 MG 24 hr tablet Take 60 mg by mouth daily.    Marland Kitchen levothyroxine (SYNTHROID, LEVOTHROID) 150 MCG tablet Take 150  mcg by mouth daily before breakfast.    . metoprolol tartrate (LOPRESSOR) 25 MG tablet Take 25 mg by mouth 2 (two) times daily.    Marland Kitchen triamcinolone cream (KENALOG) 0.1 % Apply 1 application topically 2 (two) times daily.    Marland Kitchen warfarin (COUMADIN) 5 MG tablet Take 5 mg by mouth every evening.      Results for orders placed or performed during the hospital encounter of 09/15/17 (from the past 48 hour(s))  Glucose, capillary     Status: Abnormal   Collection Time: 09/15/17 10:21 AM  Result Value Ref Range   Glucose-Capillary 113 (H) 70 - 99 mg/dL  APTT     Status: None   Collection Time: 09/15/17 10:26 AM  Result Value Ref Range   aPTT 28 24 - 36 seconds    Comment: Performed at [facility] [address], [place], [place] [address]  Protime-INR     Status: None   Collection Time: 09/15/17 10:26 AM  Result Value Ref Range   Prothrombin Time 14.0 11.4 - 15.2 seconds   INR 1.09     Comment: Performed at [facility],  [address] [place], [place] [address]   No results found.  Review of Systems  All other systems reviewed and are negative.   Blood pressure 127/68, pulse (!) 51, temperature 97.7 F (36.5 C), temperature source Oral, resp. rate 20, height 5\' 8"  (1.727 m), weight 91.6 kg, SpO2 100 %. Physical Exam  Constitutional: He is oriented to person, place, and time. He appears well-developed and well-nourished.  HENT:  Head: Atraumatic.  Eyes: EOM are normal.  Cardiovascular: Intact distal pulses.  Respiratory: Effort normal.  Musculoskeletal:  Left shoulder pain with limited ROM. NVID.  Neurological: He is alert and oriented to person, place, and time.  Skin: Skin is warm and dry.  Psychiatric: He has a normal mood and affect.     Assessment/Plan s/p injury at work with left shoulder rotator cuff tear status post repair with failure of repair and chronic pain and dysfunction.  Indicated for reverse total shoulder arthroplasty to decrease pain and improve  function. Risks / benefits of surgery discussed Consent on chart  NPO for OR Preop antibiotics   Isabella Stalling, MD 09/15/2017, 11:26 AM

## 2017-09-15 NOTE — Transfer of Care (Signed)
Immediate Anesthesia Transfer of Care Note  Patient: Eric Kim  Procedure(s) Performed: LEFT REVERSE SHOULDER ARTHROPLASTY (Left )  Patient Location: PACU  Anesthesia Type:GA combined with regional for post-op pain  Level of Consciousness: awake, alert  and oriented  Airway & Oxygen Therapy: Patient Spontanous Breathing and Patient connected to nasal cannula oxygen  Post-op Assessment: Report given to RN and Post -op Vital signs reviewed and stable  Post vital signs: Reviewed and stable  Last Vitals:  Vitals Value Taken Time  BP 124/64 09/15/2017  1:55 PM  Temp    Pulse 56 09/15/2017  1:56 PM  Resp 15 09/15/2017  1:56 PM  SpO2 97 % 09/15/2017  1:56 PM  Vitals shown include unvalidated device data.  Last Pain:  Vitals:   09/15/17 1031  TempSrc:   PainSc: 0-No pain      Patients Stated Pain Goal: 4 (25/85/27 7824)  Complications: No apparent anesthesia complications

## 2017-09-15 NOTE — Anesthesia Procedure Notes (Signed)
Procedure Name: Intubation Date/Time: 09/15/2017 12:17 PM Performed by: Mariea Clonts, CRNA Pre-anesthesia Checklist: Patient identified, Emergency Drugs available, Suction available and Patient being monitored Patient Re-evaluated:Patient Re-evaluated prior to induction Oxygen Delivery Method: Circle System Utilized Preoxygenation: Pre-oxygenation with 100% oxygen Induction Type: IV induction Ventilation: Mask ventilation without difficulty and Oral airway inserted - appropriate to patient size Laryngoscope Size: Sabra Heck and 2 Grade View: Grade I Tube type: Oral Tube size: 7.5 mm Number of attempts: 1 Airway Equipment and Method: Stylet and Oral airway Placement Confirmation: ETT inserted through vocal cords under direct vision,  positive ETCO2 and breath sounds checked- equal and bilateral Tube secured with: Tape Dental Injury: Teeth and Oropharynx as per pre-operative assessment

## 2017-09-16 ENCOUNTER — Encounter (HOSPITAL_COMMUNITY): Payer: Self-pay | Admitting: Orthopedic Surgery

## 2017-09-16 LAB — BASIC METABOLIC PANEL
Anion gap: 13 (ref 5–15)
BUN: 13 mg/dL (ref 8–23)
CHLORIDE: 100 mmol/L (ref 98–111)
CO2: 24 mmol/L (ref 22–32)
CREATININE: 0.88 mg/dL (ref 0.61–1.24)
Calcium: 8.8 mg/dL — ABNORMAL LOW (ref 8.9–10.3)
GFR calc Af Amer: >60 mL/min (ref >60)
GFR calc non Af Amer: >60 mL/min (ref >60)
GLUCOSE: 203 mg/dL — AB (ref 70–99)
Potassium: 3.8 mmol/L (ref 3.5–5.1)
Sodium: 137 mmol/L (ref 135–145)

## 2017-09-16 LAB — CBC
HEMATOCRIT: 42.6 % (ref 39.0–52.0)
HEMOGLOBIN: 14.4 g/dL (ref 13.0–17.0)
MCH: 30.7 pg (ref 26.0–34.0)
MCHC: 33.8 g/dL (ref 30.0–36.0)
MCV: 90.8 fL (ref 78.0–100.0)
Platelets: 202 10*3/uL (ref 150–400)
RBC: 4.69 MIL/uL (ref 4.22–5.81)
RDW: 12.4 % (ref 11.5–15.5)
WBC: 14.9 10*3/uL — ABNORMAL HIGH (ref 4.0–10.5)

## 2017-09-16 LAB — PROTIME-INR
INR: 1.02
Prothrombin Time: 13.3 seconds (ref 11.4–15.2)

## 2017-09-16 MED ORDER — DOCUSATE SODIUM 100 MG PO CAPS: 100.0000 mg | ORAL_CAPSULE | Freq: Three times a day (TID) | ORAL | 0 refills | Status: AC | PRN

## 2017-09-16 MED ORDER — OXYCODONE-ACETAMINOPHEN 5-325 MG PO TABS: 1.0000 | ORAL_TABLET | ORAL | 0 refills | Status: AC | PRN

## 2017-09-16 NOTE — Evaluation (Signed)
Occupational Therapy Evaluation and Discharge Patient Details Name: Eric Kim MRN: [id number] DOB: January 04, 1941 Today's Date: 09/16/2017    History of Present Illness s/p L reverse TSA. Pt with cardiac hx.   Clinical Impression   Pt is active at baseline. Continues to work rebuilding vintage cars. Pt currently requires min assist for ADL and L elbow>hand ROM exercises due to nerve block still partially intact. All education completed as per shoulder protocol and described below with pt verbalizing understanding. No further acute OT needs.   Follow Up Recommendations  Follow surgeon's recommendation for DC plan and follow-up therapies    Equipment Recommendations  None recommended by OT    Recommendations for Other Services       Precautions / Restrictions Precautions Precautions: Shoulder Type of Shoulder Precautions: no shoulder exercises, elbow>hand only Shoulder Interventions: Shoulder sling/immobilizer;Off for dressing/bathing/exercises Precaution Booklet Issued: Yes (comment) Required Braces or Orthoses: Sling Restrictions Weight Bearing Restrictions: Yes LUE Weight Bearing: Non weight bearing      Mobility Bed Mobility Overal bed mobility: Modified Independent             General bed mobility comments: HOB up, recommended pt get up to R side of bed to avoid weight through L UE.  Transfers Overall transfer level: Modified independent               General transfer comment: slow to rise, but steady    Balance                                           ADL either performed or assessed with clinical judgement   ADL Overall ADL's : Needs assistance/impaired Eating/Feeding: Independent;Sitting   Grooming: Wash/dry hands;Standing;Modified independent   Upper Body Bathing: Minimal assistance;Sitting   Lower Body Bathing: Modified independent;Sit to/from stand   Upper Body Dressing : Minimal assistance;Sitting   Lower Body  Dressing: Modified independent;Sit to/from stand   Toilet Transfer: Modified Independent           Functional mobility during ADLs: Modified independent General ADL Comments: Educated pt in positioning L UE in bed and chair, use of ice, NWB status, compensatory strategies for ADL and proper sling positioning. Reinforced with handout.     Vision Patient Visual Report: No change from baseline       Perception     Praxis      Pertinent Vitals/Pain Pain Assessment: Faces Faces Pain Scale: Hurts even more Pain Location: L shoulder with repositioning Pain Descriptors / Indicators: Grimacing;Guarding Pain Intervention(s): Monitored during session;Premedicated before session;Repositioned;Ice applied     Hand Dominance Right   Extremity/Trunk Assessment Upper Extremity Assessment Upper Extremity Assessment: LUE deficits/detail LUE Deficits / Details: performed AAROM elbow, AROM forearm to hand x 10, instructed pt to continue these exercises x 3 per day LUE Coordination: decreased gross motor   Lower Extremity Assessment Lower Extremity Assessment: Overall WFL for tasks assessed       Communication Communication Communication: HOH   Cognition Arousal/Alertness: Awake/alert Behavior During Therapy: WFL for tasks assessed/performed Overall Cognitive Status: Within Functional Limits for tasks assessed                                     General Comments       Exercises     Shoulder Instructions  Home Living Family/patient expects to be discharged to:: Private residence Living Arrangements: Spouse/significant other                                      Prior Functioning/Environment Level of Independence: Independent        Comments: pt works in an Environmental health practitioner        OT Problem List:        OT Treatment/Interventions:      OT Goals(Current goals can be found in the care plan section) Acute Rehab OT Goals Patient  Stated Goal: to be pain free  OT Frequency:     Barriers to D/C:            Co-evaluation              AM-PAC PT "6 Clicks" Daily Activity     Outcome Measure Help from another person eating meals?: None Help from another person taking care of personal grooming?: None Help from another person toileting, which includes using toliet, bedpan, or urinal?: None Help from another person bathing (including washing, rinsing, drying)?: A Little Help from another person to put on and taking off regular upper body clothing?: A Little Help from another person to put on and taking off regular lower body clothing?: None 6 Click Score: 22   End of Session    Activity Tolerance: Patient tolerated treatment well Patient left: in bed;with call bell/phone within reach  OT Visit Diagnosis: Pain                Time: 2725-3664 OT Time Calculation (min): 23 min Charges:  OT General Charges $OT Visit: 1 Visit OT Evaluation $OT Eval Low Complexity: 1 Low OT Treatments $Self Care/Home Management : 8-22 mins  09/16/2017 Nestor Lewandowsky, OTR/L Pager: [place], Haze Boyden 09/16/2017, 8:51 AM

## 2017-09-16 NOTE — Discharge Instructions (Signed)
Discharge Instructions after Reverse Total Shoulder Arthroplasty   A sling has been provided for you. You are to wear this at all times, even while sleeping, until your first post operative visit with Dr. Tamera Punt. Use ice on the shoulder intermittently over the first 48 hours after surgery.  Pain medicine has been prescribed for you.  Use your medicine liberally over the first 48 hours, and then you can begin to taper your use. You may take Extra Strength Tylenol or Tylenol only in place of the pain pills. DO NOT take ANY nonsteroidal anti-inflammatory pain medications: Advil, Motrin, Ibuprofen, Aleve, Naproxen or Naprosyn.  Take one aspirin a day for 2 weeks after surgery, unless you have an aspirin sensitivity/allergy or asthma.  Leave your dressing on until your first follow up visit.  You may shower with the dressing.  Hold your arm as if you still have your sling on while you shower. Simply allow the water to wash over the site and then pat dry. Make sure your axilla (armpit) is completely dry after showering.    Please call [phone] during normal business hours or [phone] after hours for any problems. Including the following:  - excessive redness of the incisions - drainage for more than 4 days - fever of more than 101.5 F  *Please note that pain medications will not be refilled after hours or on weekends.  Information on my medicine - Coumadin   (Warfarin)  This medication education was reviewed with me or my healthcare representative as part of my discharge preparation.   Why was Coumadin prescribed for you? Coumadin was prescribed for you because you have a blood clot or a medical condition that can cause an increased risk of forming blood clots. Blood clots can cause serious health problems by blocking the flow of blood to the heart, lung, or brain. Coumadin can prevent harmful blood clots from forming. As a reminder your indication for Coumadin is:   Stroke Prevention  Because Of Atrial Fibrillation  What test will check on my response to Coumadin? While on Coumadin (warfarin) you will need to have an INR test regularly to ensure that your dose is keeping you in the desired range. The INR (international normalized ratio) number is calculated from the result of the laboratory test called prothrombin time (PT).  If an INR APPOINTMENT HAS NOT ALREADY BEEN MADE FOR YOU please schedule an appointment to have this lab work done by your health care provider within 7 days. Your INR goal is usually a number between:  2 to 3 or your provider may give you a more narrow range like 2-2.5.  Ask your health care provider during an office visit what your goal INR is.  What  do you need to  know  About  COUMADIN? Take Coumadin (warfarin) exactly as prescribed by your healthcare provider about the same time each day.  DO NOT stop taking without talking to the doctor who prescribed the medication.  Stopping without other blood clot prevention medication to take the place of Coumadin may increase your risk of developing a new clot or stroke.  Get refills before you run out.  What do you do if you miss a dose? If you miss a dose, take it as soon as you remember on the same day then continue your regularly scheduled regimen the next day.  Do not take two doses of Coumadin at the same time.  Important Safety Information A possible side effect of Coumadin (Warfarin) is an  increased risk of bleeding. You should call your healthcare provider right away if you experience any of the following: ? Bleeding from an injury or your nose that does not stop. ? Unusual colored urine (red or dark brown) or unusual colored stools (red or black). ? Unusual bruising for unknown reasons. ? A serious fall or if you hit your head (even if there is no bleeding).  Some foods or medicines interact with Coumadin (warfarin) and might alter your response to warfarin. To help avoid this: ? Eat a balanced  diet, maintaining a consistent amount of Vitamin K. ? Notify your provider about major diet changes you plan to make. ? Avoid alcohol or limit your intake to 1 drink for women and 2 drinks for men per day. (1 drink is 5 oz. wine, 12 oz. beer, or 1.5 oz. liquor.)  Make sure that ANY health care provider who prescribes medication for you knows that you are taking Coumadin (warfarin).  Also make sure the healthcare provider who is monitoring your Coumadin knows when you have started a new medication including herbals and non-prescription products.  Coumadin (Warfarin)  Major Drug Interactions  Increased Warfarin Effect Decreased Warfarin Effect  Alcohol (large quantities) Antibiotics (esp. Septra/Bactrim, Flagyl, Cipro) Amiodarone (Cordarone) Aspirin (ASA) Cimetidine (Tagamet) Megestrol (Megace) NSAIDs (ibuprofen, naproxen, etc.) Piroxicam (Feldene) Propafenone (Rythmol SR) Propranolol (Inderal) Isoniazid (INH) Posaconazole (Noxafil) Barbiturates (Phenobarbital) Carbamazepine (Tegretol) Chlordiazepoxide (Librium) Cholestyramine (Questran) Griseofulvin Oral Contraceptives Rifampin Sucralfate (Carafate) Vitamin K   Coumadin (Warfarin) Major Herbal Interactions  Increased Warfarin Effect Decreased Warfarin Effect  Garlic Ginseng Ginkgo biloba Coenzyme Q10 Green tea St. Johns wort    Coumadin (Warfarin) FOOD Interactions  Eat a consistent number of servings per week of foods HIGH in Vitamin K (1 serving =  cup)  Collards (cooked, or boiled & drained) Kale (cooked, or boiled & drained) Mustard greens (cooked, or boiled & drained) Parsley *serving size only =  cup Spinach (cooked, or boiled & drained) Swiss chard (cooked, or boiled & drained) Turnip greens (cooked, or boiled & drained)  Eat a consistent number of servings per week of foods MEDIUM-HIGH in Vitamin K (1 serving = 1 cup)  Asparagus (cooked, or boiled & drained) Broccoli (cooked, boiled & drained, or raw &  chopped) Brussel sprouts (cooked, or boiled & drained) *serving size only =  cup Lettuce, raw (green leaf, endive, romaine) Spinach, raw Turnip greens, raw & chopped   These websites have more information on Coumadin (warfarin):  FailFactory.se; VeganReport.com.au;

## 2017-09-16 NOTE — Discharge Summary (Signed)
Patient ID: Eric Kim MRN: [id number] DOB/AGE: 20-Aug-1940 77 y.o.  Admit date: 09/15/2017 Discharge date: 09/16/2017  Admission Diagnoses:  Active Problems:   S/P reverse total shoulder arthroplasty, left   Discharge Diagnoses:  Same  Past Medical History:  Diagnosis Date  . A-fib ([place])   . Arthritis   . Cancer ([place])    skin  . Coronary artery disease   . Headache   . History of kidney stones   . HOH (hard of hearing)   . Hypercholesterolemia   . Hypertension   . Hypothyroidism   . Myocardial infarction ([place])   . Pneumonia   . Pre-diabetes   . Sleep apnea    Does not wear CPAP  . Wears contact lenses    left eye only    Surgeries: Procedure(s): LEFT REVERSE SHOULDER ARTHROPLASTY on 09/15/2017   Consultants:   Discharged Condition: Improved  Hospital Course: VIKRAM TILLETT is an 77 y.o. 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 Patient has severe unremitting pain that affects sleep, daily activities, and work/hobbies. After pre-op clearance the patient was taken to the operating room on 09/15/2017 and underwent  Procedure(s): LEFT REVERSE SHOULDER ARTHROPLASTY.    Patient was given perioperative antibiotics:  Anti-infectives (From admission, onward)   Start     Dose/Rate Route Frequency Ordered Stop   09/15/17 1800  ceFAZolin (ANCEF) IVPB 2g/100 mL premix     2 g 200 mL/hr over 30 Minutes Intravenous Every 6 hours 09/15/17 1521 09/16/17 0606   09/15/17 1015  ceFAZolin (ANCEF) IVPB 2g/100 mL premix     2 g 200 mL/hr over 30 Minutes Intravenous On call to O.R. 09/15/17 1007 09/15/17 1218   09/15/17 1015  ceFAZolin (ANCEF) 2-4 GM/100ML-% IVPB    Note to Pharmacy:  Jasmine Pang   : cabinet override      09/15/17 1015 09/15/17 1218       Patient was given sequential compression devices, early ambulation, and chemoprophylaxis to prevent DVT.  Patient benefited maximally from hospital stay and there were no  complications.    Recent vital signs:  Patient Vitals for the past 24 hrs:  BP Temp Temp src Pulse Resp SpO2 Height Weight  09/16/17 0311 129/71 98.8 F (37.1 C) Oral 67 17 91 % - -  09/15/17 2045 (!) 104/59 98.4 F (36.9 C) Oral 78 14 90 % - -  09/15/17 1519 127/71 97.9 F (36.6 C) Oral 60 (!) 6 91 % 5\' 9"  (1.753 m) 91.6 kg  09/15/17 1506 - - - (!) 56 12 94 % - -  09/15/17 1500 - (!) 97.5 F (36.4 C) - (!) 57 14 94 % - -  09/15/17 1455 121/64 - - (!) 58 12 96 % - -  09/15/17 1445 - - - (!) 55 11 96 % - -  09/15/17 1440 122/68 - - (!) 54 10 97 % - -  09/15/17 1430 - - - (!) 58 15 96 % - -  09/15/17 1425 123/74 - - (!) 58 13 96 % - -  09/15/17 1415 - - - (!) 54 10 97 % - -  09/15/17 1411 - - - (!) 54 14 96 % - -  09/15/17 1410 124/67 - - (!) 52 (!) 9 96 % - -  09/15/17 1400 - - - (!) 55 12 97 % - -  09/15/17 1356 - (!) 97.3 F (36.3 C) - - - - - -  09/15/17  1355 124/64 - - (!) 55 15 97 % - -  09/15/17 1150 125/74 - - (!) 57 (!) 9 91 % - -  09/15/17 1145 127/66 - - (!) 54 12 (!) 89 % - -  09/15/17 1140 131/68 - - (!) 55 (!) 9 96 % - -  09/15/17 1135 - - - (!) 56 14 95 % - -  09/15/17 1031 - - - - - - 5\' 8"  (1.727 m) 91.6 kg  09/15/17 1016 127/68 97.7 F (36.5 C) Oral (!) 51 20 100 % - 91.6 kg     Recent laboratory studies:  Recent Labs    09/15/17 1026 09/16/17 0433  WBC  --  14.9*  HGB  --  14.4  HCT  --  42.6  PLT  --  202  NA  --  137  K  --  3.8  CL  --  100  CO2  --  24  BUN  --  13  CREATININE  --  0.88  GLUCOSE  --  203*  INR 1.09 1.02  CALCIUM  --  8.8*     Discharge Medications:   Allergies as of 09/16/2017   No Active Allergies     Medication List    TAKE these medications   amLODipine 10 MG tablet Commonly known as:  NORVASC Take 10 mg by mouth daily.   aspirin EC 81 MG tablet Take 81 mg by mouth daily.   atorvastatin 40 MG tablet Commonly known as:  LIPITOR Take 40 mg by mouth daily.   benzonatate 200 MG capsule Commonly known as:   TESSALON Take 200 mg by mouth 3 (three) times daily as needed for cough.   docusate sodium 100 MG capsule Commonly known as:  COLACE Take 1 capsule (100 mg total) by mouth 3 (three) times daily as needed.   hydrochlorothiazide 25 MG tablet Commonly known as:  HYDRODIURIL Take 25 mg by mouth daily.   isosorbide mononitrate 60 MG 24 hr tablet Commonly known as:  IMDUR Take 60 mg by mouth daily.   levothyroxine 150 MCG tablet Commonly known as:  SYNTHROID, LEVOTHROID Take 150 mcg by mouth daily before breakfast.   metoprolol tartrate 25 MG tablet Commonly known as:  LOPRESSOR Take 25 mg by mouth 2 (two) times daily.   oxyCODONE-acetaminophen 5-325 MG tablet Commonly known as:  PERCOCET/ROXICET Take 1-2 tablets by mouth every 4 (four) hours as needed for severe pain.   triamcinolone cream 0.1 % Commonly known as:  KENALOG Apply 1 application topically 2 (two) times daily.   warfarin 5 MG tablet Commonly known as:  COUMADIN Take 5 mg by mouth every evening.       Diagnostic Studies: Dg Shoulder Left Port  Result Date: 09/15/2017 CLINICAL DATA:  77 year old male status post left shoulder arthroplasty. EXAM: LEFT SHOULDER - 1 VIEW COMPARISON:  [facility] chest radiographs 04/15/2017 and earlier. FINDINGS: Portable AP supine view at 1455 hours. Left total shoulder arthroplasty hardware appears intact with no adverse features. Surrounding postoperative soft tissue changes. No unexpected osseous changes. Stable and negative visible left chest. IMPRESSION: Left shoulder arthroplasty with no adverse features. Electronically Signed   By: Genevie Ann M.D.   On: 09/15/2017 16:39    Disposition: Discharge disposition: 01-Home or Self Care       Discharge Instructions    Call MD / Call 911   Complete by:  As directed    If you experience chest pain or shortness  of breath, CALL 911 and be transported to the hospital emergency room.  If you develope a fever above 101  F, pus (white drainage) or increased drainage or redness at the wound, or calf pain, call your surgeon's office.   Constipation Prevention   Complete by:  As directed    Drink plenty of fluids.  Prune juice may be helpful.  You may use a stool softener, such as Colace (over the counter) 100 mg twice a day.  Use MiraLax (over the counter) for constipation as needed.   Diet - low sodium heart healthy   Complete by:  As directed    Increase activity slowly as tolerated   Complete by:  As directed       Follow-up Information    Tania Ade, MD. Schedule an appointment as soon as possible for a visit in 2 weeks.   Specialty:  Orthopedic Surgery Contact information: [address] [place] [address] [phone]            Signed: Grier Mitts 09/16/2017, 8:14 AM

## 2017-09-16 NOTE — Progress Notes (Signed)
   PATIENT ID: Eric Kim   1 Day Post-Op Procedure(s) (LRB): LEFT REVERSE SHOULDER ARTHROPLASTY (Left)  Subjective: Doing well, min pain. Block hasnt worn off yet, feels good to go home today.   Objective:  Vitals:   09/15/17 2045 09/16/17 0311  BP: (!) 104/59 129/71  Pulse: 78 67  Resp: 14 17  Temp: 98.4 F (36.9 C) 98.8 F (37.1 C)  SpO2: 90% 91%     L UE dressing c/d/i Wiggles fingers, numb distally from block  Labs:  Recent Labs    09/16/17 0433  HGB 14.4   Recent Labs    09/16/17 0433  WBC 14.9*  RBC 4.69  HCT 42.6  PLT 202   Recent Labs    09/16/17 0433  NA 137  K 3.8  CL 100  CO2 24  BUN 13  CREATININE 0.88  GLUCOSE 203*  CALCIUM 8.8*    Assessment and Plan: 1 day s/p left reverse TSA OT- hand wrist elbow rom only D/c home when cleared by OT rx in chart Fu with dr. Tamera Punt in 2 weeks  VTE proph:  Coumadin, scds

## 2020-03-23 IMAGING — DX DG SHOULDER 1V*L*
1 series · 1 of 1 positions shown · non-contrast
Comparison: [REDACTED] chest radiographs
04/15/2017 and earlier.

CLINICAL DATA: 77-year-old male status post left shoulder
arthroplasty.

EXAM:
LEFT SHOULDER - 1 VIEW

[shoulder]
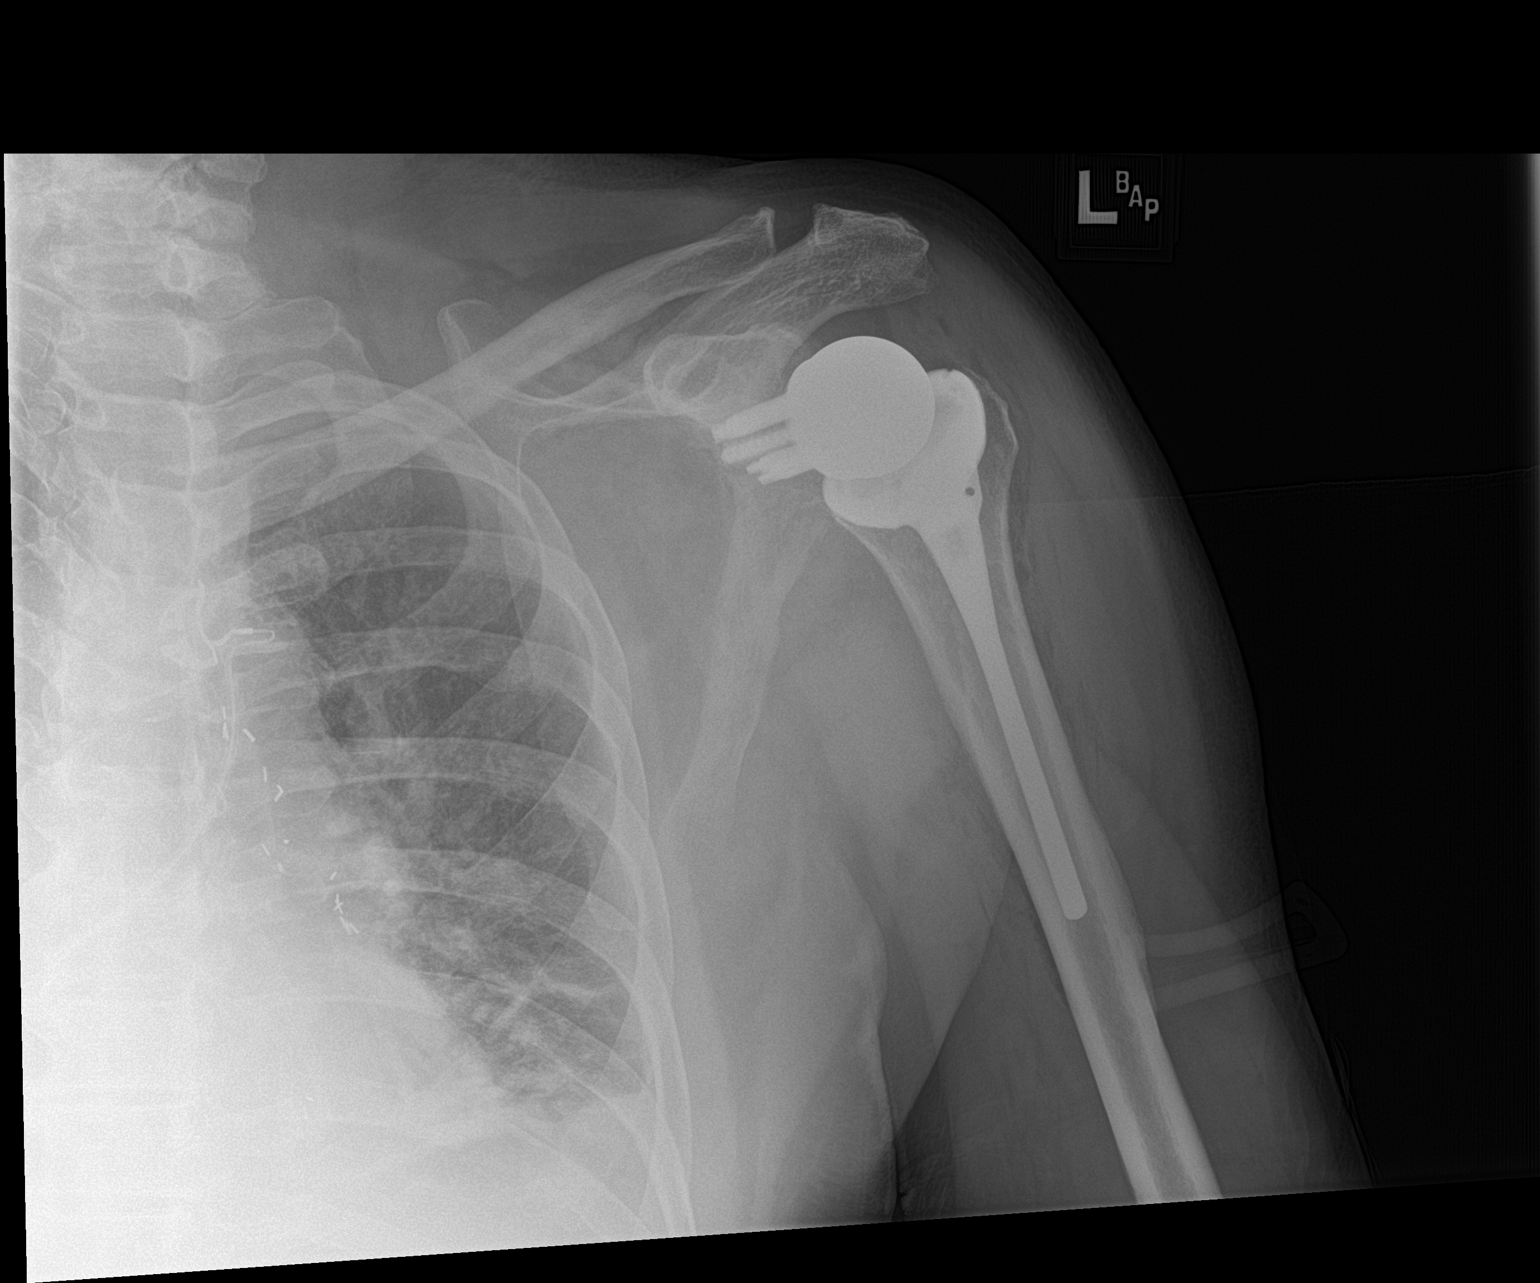

[1 of 1 positions shown; findings below may reference images not displayed]

FINDINGS: Portable AP supine view at 2522 hours. Left total shoulder
arthroplasty hardware appears intact with no adverse features.
Surrounding postoperative soft tissue changes. No unexpected osseous
changes. Stable and negative visible left chest.
IMPRESSION: Left shoulder arthroplasty with no adverse features.
